# Patient Record
Sex: Male | Born: 1990 | Race: White | Hispanic: No | Marital: Single
Health system: Southern US, Community
[De-identification: ages and names within clinical notes are randomized; demographics above are authoritative.]

---

## 2018-02-24 ENCOUNTER — Other Ambulatory Visit: Payer: Self-pay | Admitting: Otolaryngology

## 2018-02-24 DIAGNOSIS — R198 Other specified symptoms and signs involving the digestive system and abdomen: Secondary | ICD-10-CM

## 2018-02-24 DIAGNOSIS — R0989 Other specified symptoms and signs involving the circulatory and respiratory systems: Secondary | ICD-10-CM

## 2018-02-24 DIAGNOSIS — R1319 Other dysphagia: Secondary | ICD-10-CM

## 2018-02-24 DIAGNOSIS — R1310 Dysphagia, unspecified: Secondary | ICD-10-CM

## 2018-03-03 ENCOUNTER — Ambulatory Visit
Admission: RE | Admit: 2018-03-03 | Discharge: 2018-03-03 | Disposition: A | Discharge status: Home / Self Care | Payer: No Typology Code available for payment source | Source: Ambulatory Visit | Attending: Otolaryngology | Admitting: Otolaryngology

## 2018-03-03 DIAGNOSIS — R0989 Other specified symptoms and signs involving the circulatory and respiratory systems: Secondary | ICD-10-CM

## 2018-03-03 DIAGNOSIS — R1319 Other dysphagia: Secondary | ICD-10-CM

## 2018-03-03 DIAGNOSIS — R198 Other specified symptoms and signs involving the digestive system and abdomen: Secondary | ICD-10-CM

## 2018-03-03 DIAGNOSIS — R1310 Dysphagia, unspecified: Secondary | ICD-10-CM

## 2018-03-03 IMAGING — CT CT NECK W/ CM
3 of 4 series · 6 of 14 positions shown, 7 images · IV contrast (iopamidol)
Comparison: None.

CLINICAL DATA: Esophageal dysphagia with globus sensation

EXAM:
CT NECK WITH CONTRAST
TECHNIQUE: Multidetector CT imaging of the neck was performed using the
standard protocol following the bolus administration of intravenous
contrast.
CONTRAST:  75mL [10] IOPAMIDOL ([10]) INJECTION 61%

[Series 3: neck · axial · 0.46mm/px · z∈[+756,+852]mm · 2 of 145 slices shown]
[im 49/145  bone]
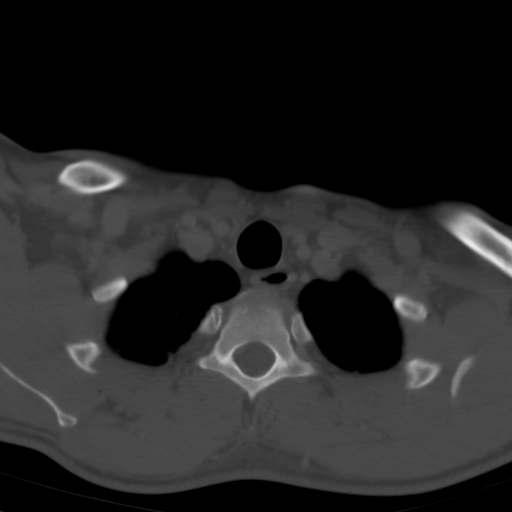
[im 97/145  bone]
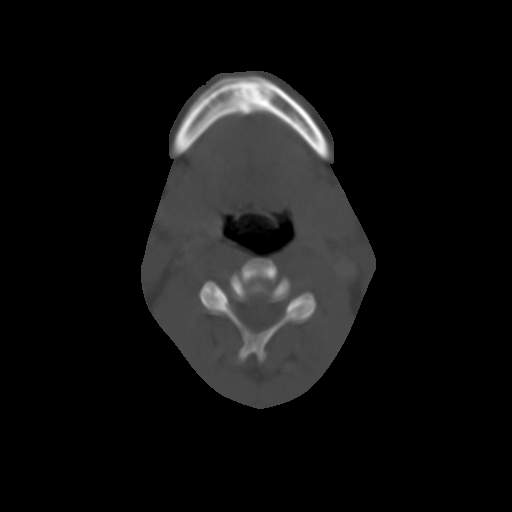

[Series 6: angled axial-oropharynx · axial · 0.42mm/px · z∈[+756,+852]mm · 2 of 144 slices shown]
[im 48/144  bone]
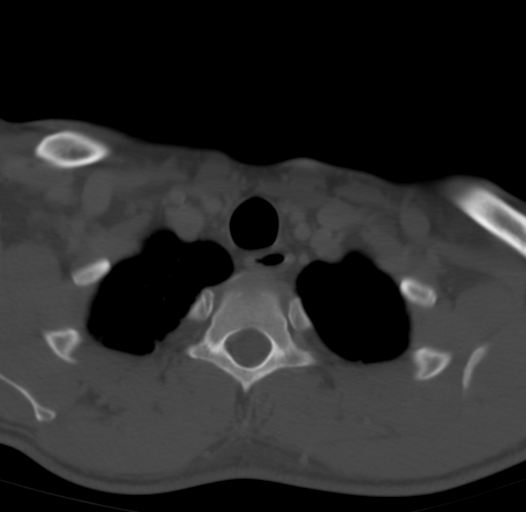
[im 96/144  bone]
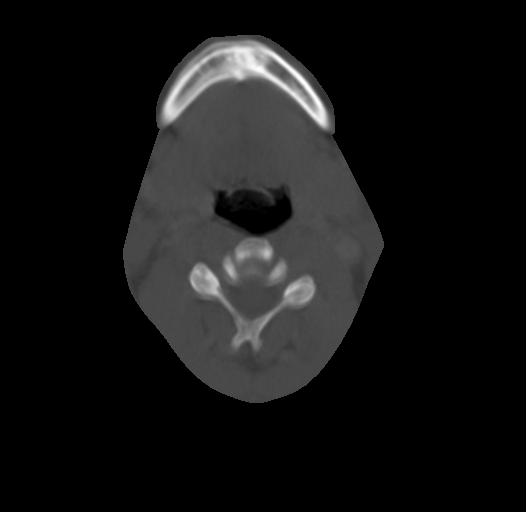

[Series 7: angled (person_name) · axial · 0.42mm/px · z∈[+740,+841]mm · 2 of 153 slices shown, 3 images]
[im 51/153  soft-tissue]
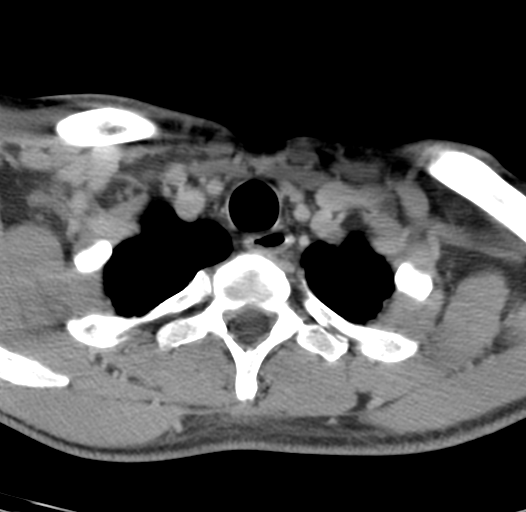
[im 51/153  bone]
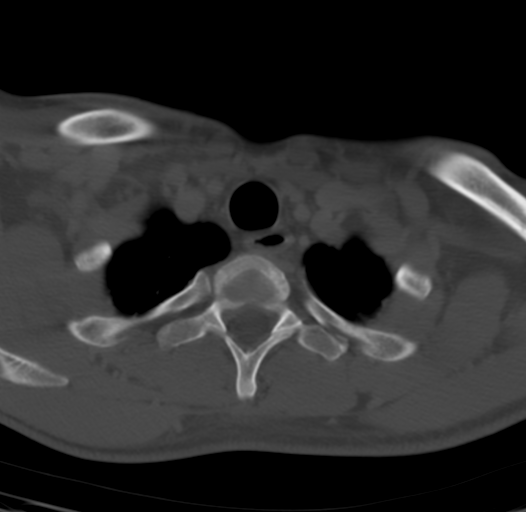
[im 102/153  bone]
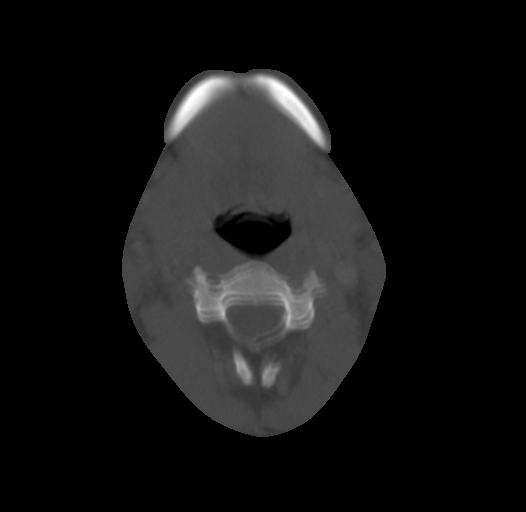

[6 of 14 positions shown; findings below may reference images not displayed]

FINDINGS: Pharynx and larynx: Motion artifact at the level of the oropharynx.
No evidence of mass or swelling

Salivary glands: Negative. There is no motion artifact at the level
of the inferior parotid tails, sublingual, and submandibular glands.

Thyroid: Negative

Lymph nodes: None enlarged or abnormal density.

Vascular: Negative.

Limited intracranial: Negative.

Visualized orbits: Negative.

Mastoids and visualized paranasal sinuses: Clear.

Skeleton: No acute or aggressive finding

Upper chest: Negative
IMPRESSION: Negative motion degraded exam.  No explanation for dysphagia.

## 2018-03-03 MED ORDER — IOPAMIDOL (ISOVUE-300) INJECTION 61%
75.0000 mL | Freq: Once | INTRAVENOUS | Status: AC | PRN
  Administered 2018-03-03: 75 mL via INTRAVENOUS

## 2021-08-14 ENCOUNTER — Ambulatory Visit: Payer: Self-pay

## 2021-08-14 ENCOUNTER — Other Ambulatory Visit: Payer: Self-pay

## 2021-08-14 ENCOUNTER — Ambulatory Visit (HOSPITAL_BASED_OUTPATIENT_CLINIC_OR_DEPARTMENT_OTHER): Payer: No Typology Code available for payment source | Admitting: Orthopaedic Surgery

## 2021-08-14 ENCOUNTER — Encounter: Payer: Self-pay | Admitting: Orthopedic Surgery

## 2021-08-14 ENCOUNTER — Ambulatory Visit: Payer: BC Managed Care – PPO | Admitting: Orthopedic Surgery

## 2021-08-14 DIAGNOSIS — M25511 Pain in right shoulder: Secondary | ICD-10-CM

## 2021-08-14 DIAGNOSIS — M2550 Pain in unspecified joint: Secondary | ICD-10-CM | POA: Diagnosis not present

## 2021-08-14 DIAGNOSIS — M545 Low back pain, unspecified: Secondary | ICD-10-CM

## 2021-08-14 NOTE — Progress Notes (Signed)
? ?Office Visit Note ?  ?Patient: Patrick Mckinney           ?Date of Birth: Nov 28, 1990           ?MRN: [id number] ?Visit Date: 08/14/2021 ?Requested by: Myrlene Broker, MD ?[address],  [place] [address] ?PCP: Myrlene Broker, MD ? ?Subjective: ?Chief Complaint  ?Patient presents with  ? Other  ?  Arm pain  ? ? ?HPI: Patrick Mckinney is a 31 year old patient with bilateral shoulder pain.  He also reports a multitude of other orthopedic complaints.  He describes a jellylike feeling in the right deltoid.  Had a Toradol injection by his primary care provider which gave him some relief.  He also reports bilateral knee tingling as well as low back pain which radiates down the right leg into the toes.  He also describes some episodic left forearm pain in the muscle with cramping.  He describes a history of prostate infections.  He has been taking Flexeril and Mobic but not too much lately.  Tried trazodone to help him sleep but that was not helpful.  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  He does work as a Building control surveyor.  All that work is very physical.  No known family history of autoimmune disease.  Most of his pain in the shoulders and hands and knees and back is in the early morning.  Describes some weakness and dull aching.  Also had some chest pain which was worked up which was negative by his report.  Does report some numbness and tingling as well in the upper extremities.  The pain does not wake him from sleep at night.  Reports occasional neck pain. ?             ?ROS: All systems reviewed are negative as they relate to the chief complaint within the history of present illness.  Patient denies  fevers or chills. ? ? ?Assessment & Plan: ?Visit Diagnoses:  ?1. Multiple joint pain   ?2. Right shoulder pain, unspecified chronicity   ?3. Low back pain, unspecified back pain laterality, unspecified chronicity, unspecified whether sciatica present   ? ? ?Plan: Impression is  multiple joint complaints.  Right shoulder does have radiographic abnormality which in the setting of history of malignant melanoma deserves MRI with contrast.  Index of suspicion is low.  Patient denies any history of trauma to the shoulder.  This is most likely some type of fibrous cortical defect but bony abnormality with history of cancer I would like to work-up further.  Lumbar spine radiographs normal.  All these multiple complaints could indicate some type of autoimmune problem.  CBC DIF c-Met sed rate C-reactive protein rheumatoid factor ANA and anti-CCP antibody ordered..  Should be able to go over those studies when he comes back in to review the MRI scan of his shoulder.  I think it is possible that the scan will be negative and the labs will be negative.  In this case this is some type of nonsurgical nonstructural soft tissue problem which we may not be able to help him with.  Follow-up after those studies. ? ?Follow-Up Instructions: Return for after MRI.  ? ?Orders:  ?Orders Placed This Encounter  ?Procedures  ? XR Shoulder Right  ? XR Lumbar Spine 2-3 Views  ? MR SHOULDER RIGHT W WO CONTRAST  ? CBC with Differential  ? Comprehensive Metabolic Panel (CMET)  ? Sed  Rate (ESR)  ? C-reactive protein  ? Rheumatoid Factor  ? Antinuclear Antib (ANA)  ? Cyclic citrul peptide antibody, IgG  ? ?No orders of the defined types were placed in this encounter. ? ? ? ? Procedures: ?No procedures performed ? ? ?Clinical Data: ?No additional findings. ? ?Objective: ?Vital Signs: There were no vitals taken for this visit. ? ?Physical Exam:  ? ?Constitutional: Patient appears well-developed ?HEENT:  ?Head: Normocephalic ?Eyes:EOM are normal ?Neck: Normal range of motion ?Cardiovascular: Normal rate ?Pulmonary/chest: Effort normal ?Neurologic: Patient is alert ?Skin: Skin is warm ?Psychiatric: Patient has normal mood and affect ? ? ?Ortho Exam: Ortho exam demonstrates full cervical spine range of motion.  5 out of 5 grip  EPL FPL interosseous resection extension bicep tricep and deltoid strength.  Full active and passive range of motion of both shoulders is present.  Rotator cuff strength is excellent bilaterally to infraspinatus supraspinatus and subscap muscle testing.  Radial pulse intact bilaterally.  No masses lymphadenopathy or skin changes noted in the shoulder region.  No AC joint tenderness is present. ? ?Patient has no nerve root tension signs lower extremities.  No paresthesias C5-T1.  Reflexes symmetric at 0 to 1+ out of 4 bilateral patella Achilles biceps and triceps.  No muscle atrophy in the legs.  Ankle knee and hip range of motion is full.  Not too much pain with forward or lateral bending.  Patient has multiple excision scars on his back from his melanoma surgery but no other rashes present.  Mild pain with forward and lateral bending but no trochanteric tenderness is present.  No warmth to any of the joints and no synovitis noted in any of the joints particularly in the hands and feet. ? ?Specialty Comments:  ?No specialty comments available. ? ?Imaging: ?XR Lumbar Spine 2-3 Views ? ?Result Date: 08/14/2021 ?AP lateral lumbar spine radiographs reviewed.  Alignment normal.  No arthritis.  Facet joints without significant degenerative change.  No spondylolisthesis.  Normal lumbar spine radiographs. ? ?XR Shoulder Right ? ?Result Date: 08/14/2021 ?AP axillary outlet radiographs right shoulder reviewed.  No AC joint arthritis or glenohumeral joint arthritis.  Visualized lung fields clear.  No fracture or dislocation.  Small cortical irregularity noted on the medial metaphyseal cortex of the proximal humerus.  No cortical destruction or intramedullary component of this lesion.  ? ? ?PMFS History: ?There are no problems to display for this patient. ? ?History reviewed. No pertinent past medical history.  ?History reviewed. No pertinent family history.  ?History reviewed. No pertinent surgical history. ?Social History   ? ?Occupational History  ? Not on file  ?Tobacco Use  ? Smoking status: Not on file  ? Smokeless tobacco: Not on file  ?Substance and Sexual Activity  ? Alcohol use: Not on file  ? Drug use: Not on file  ? Sexual activity: Not on file  ? ? ? ? ? ?

## 2021-08-16 LAB — CBC WITH DIFFERENTIAL/PLATELET
Absolute Monocytes: 498 cells/uL (ref 200–950)
Basophils Absolute: 39 cells/uL (ref 0–200)
Basophils Relative: 0.7 %
Eosinophils Absolute: 39 cells/uL (ref 15–500)
Eosinophils Relative: 0.7 %
HCT: 50.9 % — ABNORMAL HIGH (ref 38.5–50.0)
Hemoglobin: 17.2 g/dL — ABNORMAL HIGH (ref 13.2–17.1)
Lymphs Abs: 1775 cells/uL (ref 850–3900)
MCH: 29.4 pg (ref 27.0–33.0)
MCHC: 33.8 g/dL (ref 32.0–36.0)
MCV: 86.9 fL (ref 80.0–100.0)
MPV: 10.8 fL (ref 7.5–12.5)
Monocytes Relative: 8.9 %
Neutro Abs: 3248 cells/uL (ref 1500–7800)
Neutrophils Relative %: 58 %
Platelets: 233 10*3/uL (ref 140–400)
RBC: 5.86 10*6/uL — ABNORMAL HIGH (ref 4.20–5.80)
RDW: 12.7 % (ref 11.0–15.0)
Total Lymphocyte: 31.7 %
WBC: 5.6 10*3/uL (ref 3.8–10.8)

## 2021-08-16 LAB — COMPREHENSIVE METABOLIC PANEL
AG Ratio: 2.3 (calc) (ref 1.0–2.5)
ALT: 29 U/L (ref 9–46)
AST: 20 U/L (ref 10–40)
Albumin: 5.2 g/dL — ABNORMAL HIGH (ref 3.6–5.1)
Alkaline phosphatase (APISO): 70 U/L (ref 36–130)
BUN: 13 mg/dL (ref 7–25)
CO2: 27 mmol/L (ref 20–32)
Calcium: 10.2 mg/dL (ref 8.6–10.3)
Chloride: 103 mmol/L (ref 98–110)
Creat: 0.92 mg/dL (ref 0.60–1.26)
Globulin: 2.3 g/dL (calc) (ref 1.9–3.7)
Glucose, Bld: 98 mg/dL (ref 65–99)
Potassium: 4.5 mmol/L (ref 3.5–5.3)
Sodium: 139 mmol/L (ref 135–146)
Total Bilirubin: 0.6 mg/dL (ref 0.2–1.2)
Total Protein: 7.5 g/dL (ref 6.1–8.1)

## 2021-08-16 LAB — RHEUMATOID FACTOR: Rheumatoid fact SerPl-aCnc: <14 IU/mL (ref <14)

## 2021-08-16 LAB — C-REACTIVE PROTEIN: CRP: 1.2 mg/L (ref <8.0)

## 2021-08-16 LAB — SEDIMENTATION RATE: Sed Rate: 6 mm/h (ref 0–15)

## 2021-08-16 LAB — CYCLIC CITRUL PEPTIDE ANTIBODY, IGG: Cyclic Citrullin Peptide Ab: <16 UNITS

## 2021-08-16 LAB — ANA: Anti Nuclear Antibody (ANA): NEGATIVE

## 2021-08-18 ENCOUNTER — Encounter: Payer: Self-pay | Admitting: Orthopedic Surgery

## 2021-08-18 NOTE — Telephone Encounter (Signed)
Unlikely sorry

## 2021-08-19 ENCOUNTER — Telehealth: Payer: Self-pay | Admitting: Orthopedic Surgery

## 2021-08-19 NOTE — Telephone Encounter (Signed)
Spoke with patient he asked if he can have his MRI in [place] instead of [place].  The number to contact patient is [phone] ?

## 2021-08-20 NOTE — Telephone Encounter (Signed)
Already spoke with pt about this.  ?

## 2021-08-24 ENCOUNTER — Ambulatory Visit: Payer: No Typology Code available for payment source | Admitting: Orthopedic Surgery

## 2021-08-30 ENCOUNTER — Other Ambulatory Visit: Payer: BC Managed Care – PPO

## 2021-08-31 ENCOUNTER — Encounter: Payer: Self-pay | Admitting: Orthopedic Surgery

## 2021-08-31 ENCOUNTER — Other Ambulatory Visit: Payer: Self-pay | Admitting: Orthopedic Surgery

## 2021-08-31 ENCOUNTER — Telehealth: Payer: Self-pay | Admitting: *Deleted

## 2021-08-31 DIAGNOSIS — Z9889 Other specified postprocedural states: Secondary | ICD-10-CM

## 2021-08-31 NOTE — Telephone Encounter (Signed)
Received from [facility] dept stating pt needs XR orbits sent over to them, pt is there to have MRI and cant schedule him until he has xr orbits done and they can get these done today and then reschedule pt for MRI.  ? ?Order has been faxed to [phone] ?

## 2021-09-03 ENCOUNTER — Telehealth: Payer: Self-pay | Admitting: Orthopedic Surgery

## 2021-09-03 NOTE — Telephone Encounter (Signed)
Yes.  Has he had MRI yet.  I could not find it.

## 2021-09-03 NOTE — Telephone Encounter (Signed)
Pt called requesting to have his MRI results read over the phone. Informed pt our facility would prefer pt to come in office for reading but I would ask. Pt phone number is [phone]. ?

## 2021-09-04 ENCOUNTER — Other Ambulatory Visit: Payer: BC Managed Care – PPO

## 2021-09-04 NOTE — Telephone Encounter (Signed)
I called scan ok

## 2021-09-04 NOTE — Telephone Encounter (Signed)
Report printed for you to review. Can access through [employer].  ?
